# Patient Record
Sex: Male | Born: 1975 | Race: Black or African American | Hispanic: No | Marital: Married | Smoking: Never smoker
Health system: Southern US, Community
[De-identification: ages and names within clinical notes are randomized; demographics above are authoritative.]

---

## 2006-02-19 ENCOUNTER — Emergency Department: Payer: Self-pay | Admitting: General Practice

## 2008-12-19 ENCOUNTER — Ambulatory Visit: Payer: Self-pay | Admitting: Podiatry

## 2008-12-26 ENCOUNTER — Ambulatory Visit: Payer: Self-pay | Admitting: Internal Medicine

## 2015-02-06 ENCOUNTER — Emergency Department: Payer: Self-pay | Admitting: Emergency Medicine

## 2015-08-17 ENCOUNTER — Ambulatory Visit (INDEPENDENT_AMBULATORY_CARE_PROVIDER_SITE_OTHER): Payer: BLUE CROSS/BLUE SHIELD | Admitting: Podiatry

## 2015-08-17 ENCOUNTER — Ambulatory Visit (INDEPENDENT_AMBULATORY_CARE_PROVIDER_SITE_OTHER): Payer: BLUE CROSS/BLUE SHIELD

## 2015-08-17 ENCOUNTER — Encounter: Payer: Self-pay | Admitting: Podiatry

## 2015-08-17 DIAGNOSIS — M7750 Other enthesopathy of unspecified foot: Secondary | ICD-10-CM | POA: Diagnosis not present

## 2015-08-17 DIAGNOSIS — R52 Pain, unspecified: Secondary | ICD-10-CM

## 2015-08-17 DIAGNOSIS — M778 Other enthesopathies, not elsewhere classified: Secondary | ICD-10-CM

## 2015-08-17 DIAGNOSIS — M779 Enthesopathy, unspecified: Secondary | ICD-10-CM

## 2015-08-17 MED ORDER — MELOXICAM 15 MG PO TABS: 15.0000 mg | ORAL_TABLET | Freq: Every day | ORAL | Status: AC

## 2015-08-17 MED ORDER — METHYLPREDNISOLONE 4 MG PO TBPK: ORAL_TABLET | ORAL | Status: AC
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
## 2015-09-16 ENCOUNTER — Ambulatory Visit: Payer: BLUE CROSS/BLUE SHIELD | Admitting: Podiatry

## 2015-10-21 ENCOUNTER — Encounter: Payer: Self-pay | Admitting: Podiatry

## 2015-10-21 ENCOUNTER — Ambulatory Visit: Payer: BLUE CROSS/BLUE SHIELD | Admitting: Podiatry

## 2015-10-21 VITALS — BP 131/79 | HR 66 | Resp 18

## 2015-10-21 DIAGNOSIS — M779 Enthesopathy, unspecified: Principal | ICD-10-CM

## 2015-10-21 DIAGNOSIS — M778 Other enthesopathies, not elsewhere classified: Secondary | ICD-10-CM

## 2015-10-21 NOTE — Progress Notes (Signed)
Presents today to pickup orthotics. Both oral and home-going instructions were provided for the care and use of the orthotics. I will follow-up with him in 4-6 weeks.

## 2015-10-21 NOTE — Patient Instructions (Signed)

## 2015-11-18 ENCOUNTER — Encounter: Payer: Self-pay | Admitting: Podiatry

## 2015-11-18 ENCOUNTER — Ambulatory Visit: Payer: BLUE CROSS/BLUE SHIELD | Admitting: Podiatry

## 2015-12-01 DIAGNOSIS — M7750 Other enthesopathy of unspecified foot: Secondary | ICD-10-CM

## 2015-12-02 ENCOUNTER — Encounter (INDEPENDENT_AMBULATORY_CARE_PROVIDER_SITE_OTHER): Payer: BLUE CROSS/BLUE SHIELD | Admitting: Podiatry

## 2015-12-02 NOTE — Progress Notes (Signed)
This encounter was created in error - please disregard.

## 2016-01-11 ENCOUNTER — Telehealth: Payer: Self-pay | Admitting: *Deleted

## 2016-01-11 ENCOUNTER — Encounter: Payer: Self-pay | Admitting: Podiatry

## 2016-01-11 ENCOUNTER — Ambulatory Visit (INDEPENDENT_AMBULATORY_CARE_PROVIDER_SITE_OTHER): Payer: BLUE CROSS/BLUE SHIELD | Admitting: Podiatry

## 2016-01-11 VITALS — BP 147/92 | HR 76 | Resp 12

## 2016-01-11 DIAGNOSIS — M7750 Other enthesopathy of unspecified foot: Secondary | ICD-10-CM | POA: Diagnosis not present

## 2016-01-11 DIAGNOSIS — M779 Enthesopathy, unspecified: Principal | ICD-10-CM

## 2016-01-11 DIAGNOSIS — M5432 Sciatica, left side: Secondary | ICD-10-CM

## 2016-01-11 DIAGNOSIS — M778 Other enthesopathies, not elsewhere classified: Secondary | ICD-10-CM

## 2016-01-11 NOTE — Telephone Encounter (Addendum)
-----   Message from Kristian Covey, [facility] sent at 01/11/2016  2:48 PM EST ----- Regarding: Ortho referral  Order in for referral - Eul[facility] Pont and [facility] office preferred. Thanks!  Referral form, pt demographics and clinicals faxed.

## 2016-01-11 NOTE — Progress Notes (Signed)
Christian Morales presents today for follow-up of his subtalar joint capsulitis. After the injection he stated he had a couple of days there was very hard for him to walk however he picked up his orthotics and since that time he states that his feet have been doing better. He still has days where his feet get tired and painful but the majority of his pain is associated with pain radiating down from his back through his buttocks and down his leg. He states sometimes it takes to 3 days for me to get over this and it feels like his down deep deep in my buttock. He states it really doesn't hurt just to move his leg but anytime he standing on the leg and makes certain twist or turn it hurts terribly he says.  Objective: Vital signs are stable he is alert and oriented 3. Pulses are palpable bilateral foot no reproducible pain to the feet bilateral.  Assessment: Resolving capsulitis subtalar joint bilateral foot.  Plan: Continue the use of the orthotics and we will refer him to [facility] for a check of his hip.
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
